# Patient Record
Sex: Female | Born: 2005 | Race: White | Hispanic: No | Marital: Single | Smoking: Never smoker
Health system: Southern US, Community
[De-identification: ages and names within clinical notes are randomized; demographics above are authoritative.]

## PROBLEM LIST (undated history)

## (undated) DIAGNOSIS — T7840XA Allergy, unspecified, initial encounter: Secondary | ICD-10-CM

## (undated) DIAGNOSIS — H53009 Unspecified amblyopia, unspecified eye: Secondary | ICD-10-CM

## (undated) DIAGNOSIS — J302 Other seasonal allergic rhinitis: Secondary | ICD-10-CM

## (undated) HISTORY — DX: Other seasonal allergic rhinitis: J30.2

## (undated) HISTORY — DX: Unspecified amblyopia, unspecified eye: H53.009

## (undated) HISTORY — DX: Allergy, unspecified, initial encounter: T78.40XA

---
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
## 2023-01-06 ENCOUNTER — Encounter: Payer: Self-pay | Admitting: Pediatrics
